# Patient Record
Sex: Female | Born: 1965 | Race: White | Hispanic: No | Marital: Married
Health system: Southern US, Community
[De-identification: ages and names within clinical notes are randomized; demographics above are authoritative.]

## PROBLEM LIST (undated history)

## (undated) HISTORY — PX: ABDOMINAL HYSTERECTOMY: SHX81

---

## 2011-08-18 ENCOUNTER — Ambulatory Visit: Payer: Self-pay | Admitting: Family Medicine

## 2011-11-25 ENCOUNTER — Ambulatory Visit: Payer: Self-pay | Admitting: Obstetrics and Gynecology

## 2011-11-25 LAB — BASIC METABOLIC PANEL
Anion Gap: 11 (ref 7–16)
BUN: 9 mg/dL (ref 7–18)
Chloride: 104 mmol/L (ref 98–107)
Creatinine: 0.57 mg/dL — ABNORMAL LOW (ref 0.60–1.30)
EGFR (Non-African Amer.): >60
Glucose: 100 mg/dL — ABNORMAL HIGH (ref 65–99)
Osmolality: 284 (ref 275–301)
Potassium: 4.7 mmol/L (ref 3.5–5.1)
Sodium: 143 mmol/L (ref 136–145)

## 2011-11-25 LAB — HEMOGLOBIN: HGB: 13.2 g/dL (ref 12.0–16.0)

## 2011-11-25 LAB — PREGNANCY, URINE: Pregnancy Test, Urine: NEGATIVE m[IU]/mL

## 2011-11-30 ENCOUNTER — Ambulatory Visit: Payer: Self-pay | Admitting: Obstetrics and Gynecology

## 2011-12-01 LAB — BASIC METABOLIC PANEL
Anion Gap: 10 (ref 7–16)
BUN: 9 mg/dL (ref 7–18)
Calcium, Total: 9 mg/dL (ref 8.5–10.1)
Creatinine: 0.77 mg/dL (ref 0.60–1.30)
EGFR (African American): >60
EGFR (Non-African Amer.): >60
Glucose: 168 mg/dL — ABNORMAL HIGH (ref 65–99)
Osmolality: 284 (ref 275–301)
Potassium: 3.9 mmol/L (ref 3.5–5.1)
Sodium: 141 mmol/L (ref 136–145)

## 2012-10-11 ENCOUNTER — Ambulatory Visit: Payer: Self-pay | Admitting: Family Medicine

## 2014-01-25 ENCOUNTER — Ambulatory Visit: Payer: Self-pay | Admitting: Family Medicine

## 2015-01-27 NOTE — Op Note (Signed)
PATIENT NAME:  Emma Beasley, HILLIGOSS MR#:  [id number] DATE OF BIRTH:  1966-08-10  DATE OF PROCEDURE:  11/30/2011  PREOPERATIVE DIAGNOSES: 1. Menorrhagia.  2. Fibroid uterus.   POSTOPERATIVE DIAGNOSES:  1. Menorrhagia.  2. Fibroid uterus.   PROCEDURES:  1. Laparoscopic supracervical hysterectomy.  2. Cystoscopy.   SURGEON: Suzy Bouchard, MD   FIRST ASSISTANT: Veatrice Bourbon, MD  ANESTHESIA: General.   INDICATIONS: This is a 49 year old gravida 1, para 1 obese female with a long history of abnormal uterine bleeding and heavy menorrhagia. The patient was noted to have at least five fibroids and a submucosal fibroid on saline infusion sonohysterography. Endometrial biopsy negative.   DESCRIPTION OF PROCEDURE: After adequate general endotracheal anesthesia, the patient was prepped and draped in normal sterile fashion. Abdominal, perineal and vaginal prep performed. The patient did receive 2 grams of IV cefoxitin prior to commencement of the case. A speculum was placed in the vagina and the anterior cervix was grasped with a single-tooth tenaculum and a uterine sound placed into the endometrial cavity and secured together to be used for uterine manipulation during the procedure. A Foley catheter was placed into the bladder and yielded 100 mL clear urine. An extreme obese abdomen was then evaluated. Two Allis clamps were placed on the infraumbilical area and injected with 0.5% Marcaine without epinephrine. Towel clips were used to help raise the abdomen as the laparoscope was advanced into the abdominal cavity under direct visualization with the Optiview cannula. The patient's abdomen was insufflated with carbon dioxide. The patient was placed in deep Trendelenburg given extreme obesity. A second port site was placed 3 cm medial to the left anterior iliac spine. A 10 mm port trocar was advanced into the abdominal cavity under direct visualization. A third port site was placed on the right side 3 cm  medial to the anterior iliac spine and again a 10 mm port advanced into the abdominal cavity under direct visualization. Visualization was difficult given the amount of redundant bowel with preperitoneal fat. Visualization was diminished. A fourth port site was placed to the left lower abdomen and a 10 mm port  again was advanced under direct visualization. A fan retractor was brought up to the operative field and this was used to retract the bowel cephalad. A single-tooth tenaculum was placed on the left cornua and retracted to the right. A Harmonic scalpel was brought up to the operative field and the round ligament was cauterized and opened. The utero-ovarian ligament was cauterized with Kleppingers and then transected with the Harmonic scalpel. Serial bites through the broad ligament was then performed. The anterior leaf of the peritoneum was then opened with the Harmonic scalpel. Ultimately, the left uterine artery was cauterized and transected with the Harmonic scalpel. Attention was then directed to the right round ligament after grasping the uterus and retracting the uterus to the left. The round ligament was opened followed by the right. The utero-ovarian ligament was then clamped, transected. Serial bites of the broad ligament and ultimately the right uterine artery was visualized, cauterized and opened with the Harmonic scalpel. The uterus was then transected at the level of the uterosacral ligaments. The uterine sound was removed. A Kleppinger Bovie was placed into the cervical stump and cauterization then took place. No active bleeding at that point. The morcellator was then brought up to the operative field and placed through one of the two left lower abdominal port sites, and the uterus was then morcellated in standard fashion. The patient's abdomen was  copiously irrigated. Some spot cautery was used with the Bovie with good hemostatic effect. Given the amount of dissection and cautery on the left,  the surgeon felt it to be prudent to do cystoscopy, which was performed after injecting an amp of indigo carmine intravenously. The bladder was filled and the ureteral ostia were identified with normal pluming effect of blue dye bilaterally. Foley was replaced. Bladder was drained. The cervical stump and adnexal structures were all hemostatic. Pressure was lowered to 8 mmHg to ensure this. The patient's abdomen was then deflated and the incisions were closed with two layer fashion with a deeper layer with a 2-0 Vicryl suture and the skin was then closed with interrupted 4-0 Vicryl suture. The skin was reapproximated with Steri-Strips and sterile dressings  applied. The single-tooth tenaculum was removed from the cervix and there was no active bleeding. The procedure was terminated. Estimated blood loss 100 mL.         Intraoperative fluids 1400 mL. The patient tolerated the procedure well and went to the Recovery Room in good condition.  ____________________________ Suzy Bouchardhomas J. Schermerhorn, MD tjs:slb D: 11/30/2011 10:53:39 ET T: 11/30/2011 11:04:21 ET JOB#: [id number]  cc: Suzy Bouchardhomas J. Schermerhorn, MD, <Dictator> Suzy BouchardHOMAS J SCHERMERHORN MD ELECTRONICALLY SIGNED 12/02/2011 9:39

## 2015-01-27 NOTE — Discharge Summary (Signed)
PATIENT NAME:  Emma Beasley, Emma Beasley MR#:  [id number] DATE OF BIRTH:  10-27-1965  DATE OF ADMISSION:  11/30/2011 DATE OF DISCHARGE:  12/01/2011  PRINCIPAL  PROCEDURE: Laparoscopic supracervical hysterectomy.    HOSPITAL COURSE: On postoperative day one, the patient was doing well, minimal pain, tolerating regular food. Vital signs were stable. Hematocrit was 35.1 and BUN and creatinine of 9 and 0.7, respectively. The patient is discharged to home in good condition.   DISCHARGE MEDICATIONS:  1. Norco 5/325, 1 to 2 tablets every 4 to 6 hours as needed.  2. Naprosyn 500 mg b.i.d.  as needed for pain.   FOLLOWUP: The patient will follow up with Dr. Feliberto GottronSchermerhorn in two weeks or before if she has wound drainage, fever, nausea, vomiting.   ____________________________ Suzy Bouchardhomas J. Schermerhorn, MD tjs:cbb D: 12/01/2011 08:48:18 ET T: 12/01/2011 16:35:31 ET JOB#: [id number]  cc: Suzy Bouchardhomas J. Schermerhorn, MD, <Dictator> Suzy BouchardHOMAS J SCHERMERHORN MD ELECTRONICALLY SIGNED 12/02/2011 9:39

## 2015-01-29 ENCOUNTER — Ambulatory Visit
Admit: 2015-01-29 | Disposition: A | Discharge status: Home / Self Care | Payer: Self-pay | Attending: Family Medicine | Admitting: Family Medicine

## 2016-02-20 ENCOUNTER — Other Ambulatory Visit: Payer: Self-pay | Admitting: Family Medicine

## 2016-02-20 DIAGNOSIS — Z1231 Encounter for screening mammogram for malignant neoplasm of breast: Secondary | ICD-10-CM

## 2016-02-28 ENCOUNTER — Ambulatory Visit: Payer: Self-pay

## 2016-03-23 ENCOUNTER — Ambulatory Visit
Admission: RE | Admit: 2016-03-23 | Discharge: 2016-03-23 | Disposition: A | Discharge status: Home / Self Care | Payer: Federal, State, Local not specified - PPO | Source: Ambulatory Visit | Attending: Family Medicine | Admitting: Family Medicine

## 2016-03-23 DIAGNOSIS — Z1231 Encounter for screening mammogram for malignant neoplasm of breast: Secondary | ICD-10-CM | POA: Insufficient documentation

## 2016-03-26 ENCOUNTER — Other Ambulatory Visit: Payer: Self-pay | Admitting: Family Medicine

## 2016-03-26 DIAGNOSIS — N6459 Other signs and symptoms in breast: Secondary | ICD-10-CM

## 2016-03-26 DIAGNOSIS — N6310 Unspecified lump in the right breast, unspecified quadrant: Secondary | ICD-10-CM

## 2016-04-02 ENCOUNTER — Ambulatory Visit
Admission: RE | Admit: 2016-04-02 | Discharge: 2016-04-02 | Disposition: A | Discharge status: Home / Self Care | Payer: Federal, State, Local not specified - PPO | Source: Ambulatory Visit | Attending: Family Medicine | Admitting: Family Medicine

## 2016-04-02 DIAGNOSIS — N6310 Unspecified lump in the right breast, unspecified quadrant: Secondary | ICD-10-CM

## 2016-04-02 DIAGNOSIS — N6459 Other signs and symptoms in breast: Secondary | ICD-10-CM

## 2016-04-02 DIAGNOSIS — N63 Unspecified lump in breast: Secondary | ICD-10-CM | POA: Diagnosis not present

## 2017-03-18 ENCOUNTER — Other Ambulatory Visit: Payer: Self-pay | Admitting: Family Medicine

## 2017-03-18 DIAGNOSIS — Z1231 Encounter for screening mammogram for malignant neoplasm of breast: Secondary | ICD-10-CM

## 2017-04-01 ENCOUNTER — Ambulatory Visit: Payer: Federal, State, Local not specified - PPO

## 2017-04-05 ENCOUNTER — Ambulatory Visit
Admission: RE | Admit: 2017-04-05 | Discharge: 2017-04-05 | Disposition: A | Discharge status: Home / Self Care | Payer: Federal, State, Local not specified - PPO | Source: Ambulatory Visit | Attending: Family Medicine | Admitting: Family Medicine

## 2017-04-05 DIAGNOSIS — Z1231 Encounter for screening mammogram for malignant neoplasm of breast: Secondary | ICD-10-CM | POA: Diagnosis present

## 2018-04-13 ENCOUNTER — Other Ambulatory Visit: Payer: Self-pay | Admitting: Family Medicine

## 2018-04-13 DIAGNOSIS — Z1231 Encounter for screening mammogram for malignant neoplasm of breast: Secondary | ICD-10-CM

## 2018-04-29 ENCOUNTER — Ambulatory Visit
Admission: RE | Admit: 2018-04-29 | Discharge: 2018-04-29 | Disposition: A | Discharge status: Home / Self Care | Payer: Federal, State, Local not specified - PPO | Source: Ambulatory Visit | Attending: Family Medicine | Admitting: Family Medicine

## 2018-04-29 DIAGNOSIS — Z1231 Encounter for screening mammogram for malignant neoplasm of breast: Secondary | ICD-10-CM | POA: Insufficient documentation

## 2019-05-04 ENCOUNTER — Other Ambulatory Visit: Payer: Self-pay | Admitting: Family Medicine

## 2019-05-04 DIAGNOSIS — Z1231 Encounter for screening mammogram for malignant neoplasm of breast: Secondary | ICD-10-CM

## 2019-06-14 ENCOUNTER — Ambulatory Visit
Admission: RE | Admit: 2019-06-14 | Discharge: 2019-06-14 | Disposition: A | Discharge status: Home / Self Care | Payer: Federal, State, Local not specified - PPO | Source: Ambulatory Visit | Attending: Family Medicine | Admitting: Family Medicine

## 2019-06-14 DIAGNOSIS — Z1231 Encounter for screening mammogram for malignant neoplasm of breast: Secondary | ICD-10-CM | POA: Insufficient documentation

## 2021-04-12 IMAGING — MG MM DIGITAL SCREENING BILAT W/ TOMO W/ CAD
6 of 12 series · 6 of 36 positions shown · non-contrast
Comparison: Previous exam(s).

CLINICAL DATA: Screening.

EXAM:
DIGITAL SCREENING BILATERAL MAMMOGRAM WITH TOMO AND CAD

[R MLO synth-2D (1 of 2)]
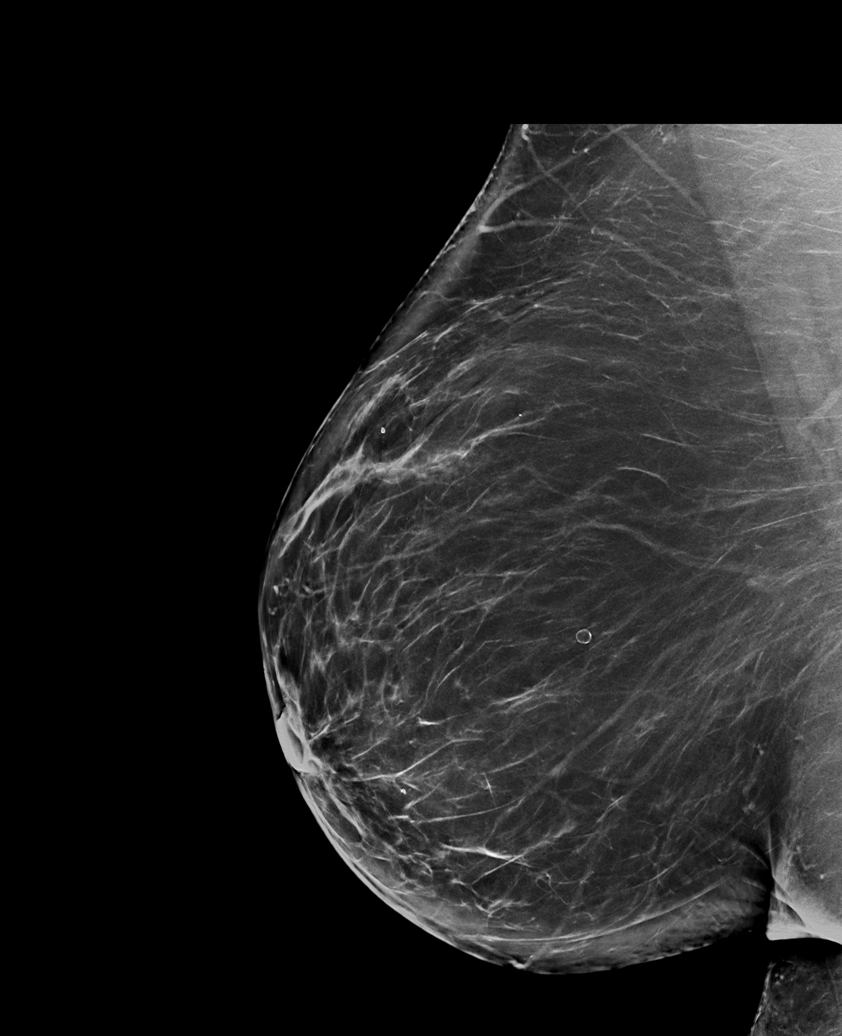

[L CV synth-2D]
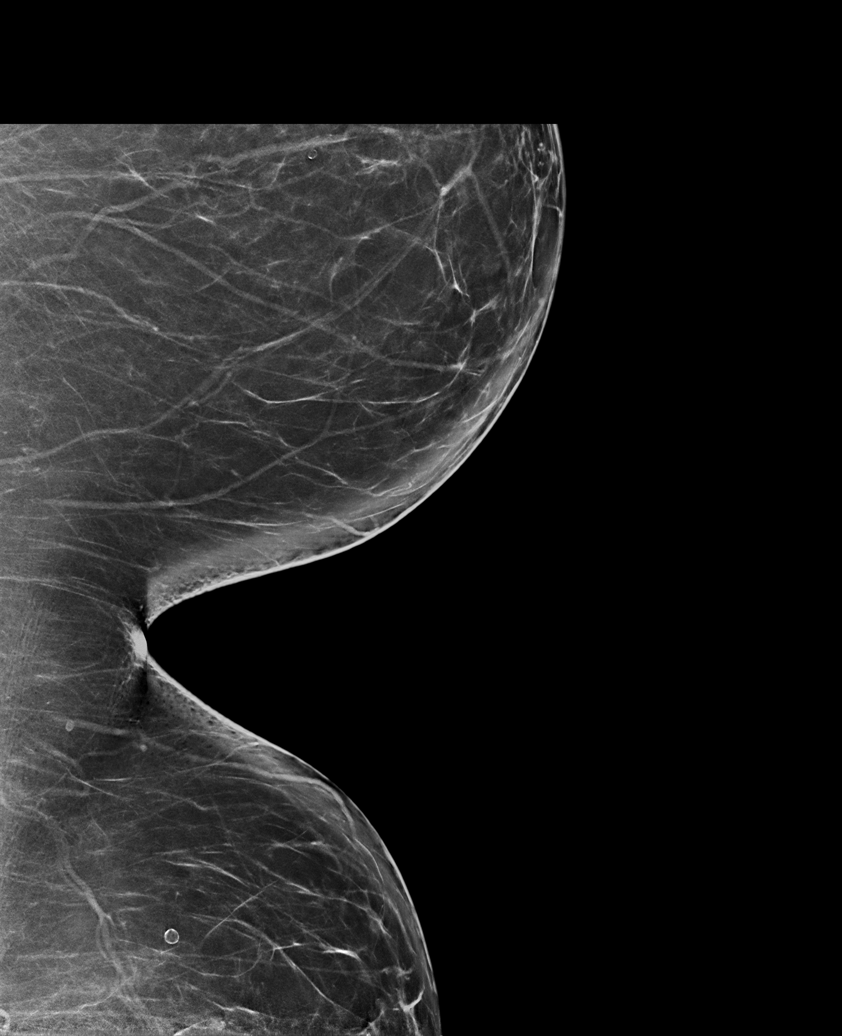

[L MLO synth-2D]
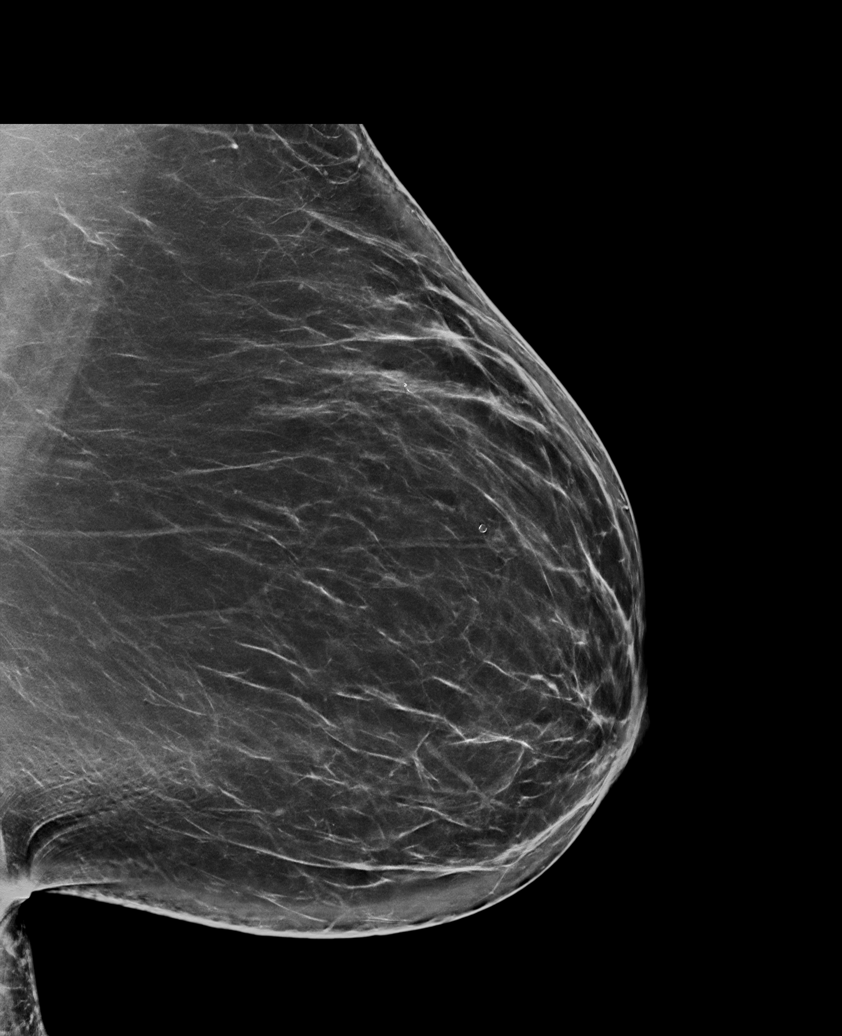

[R MLO synth-2D (2 of 2)]
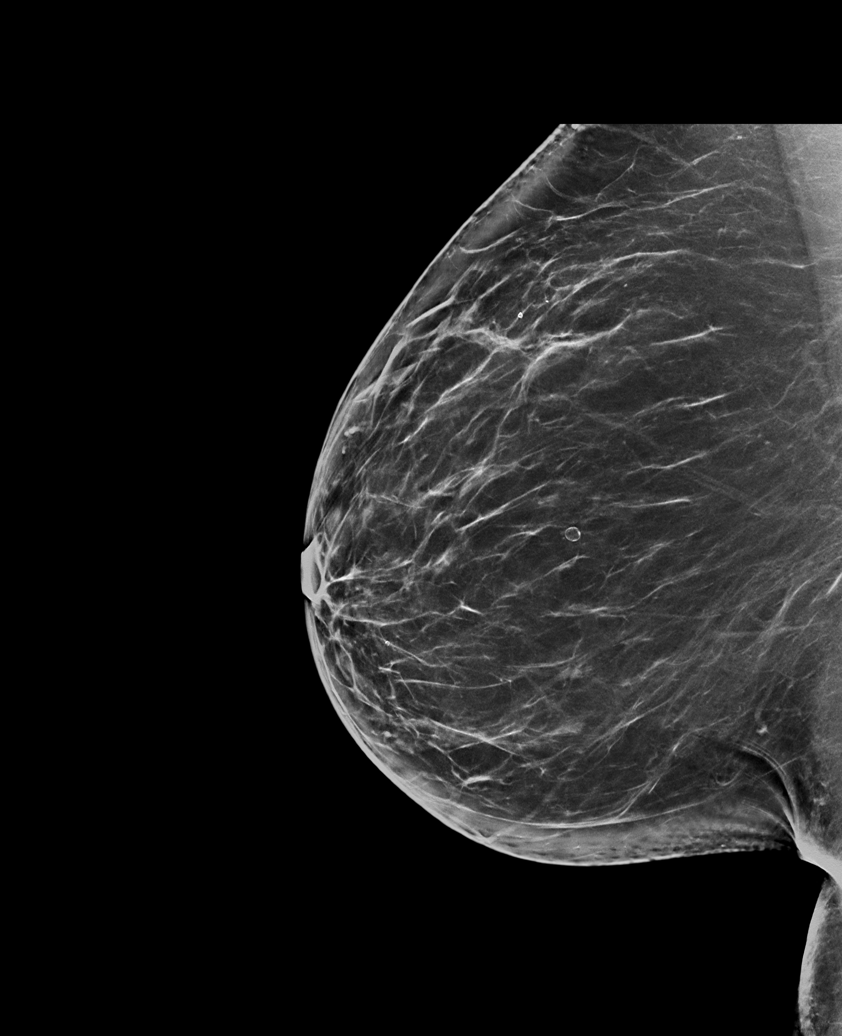

[R CC synth-2D]
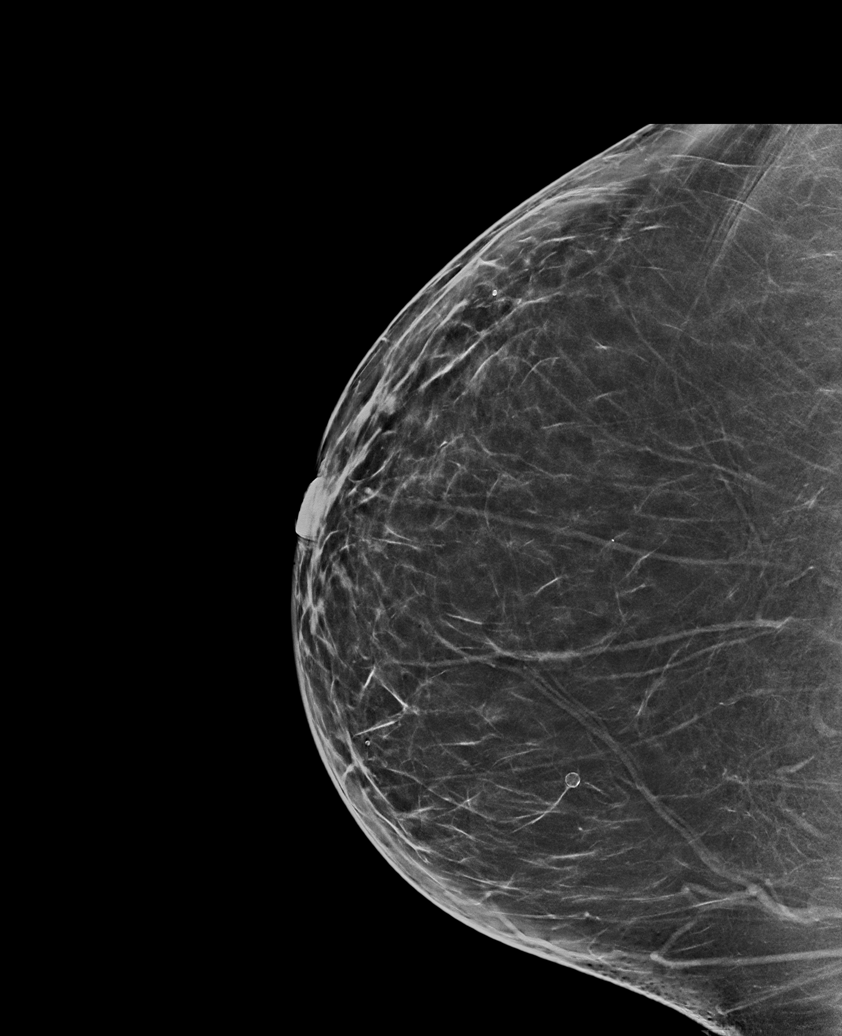

[L CC synth-2D]
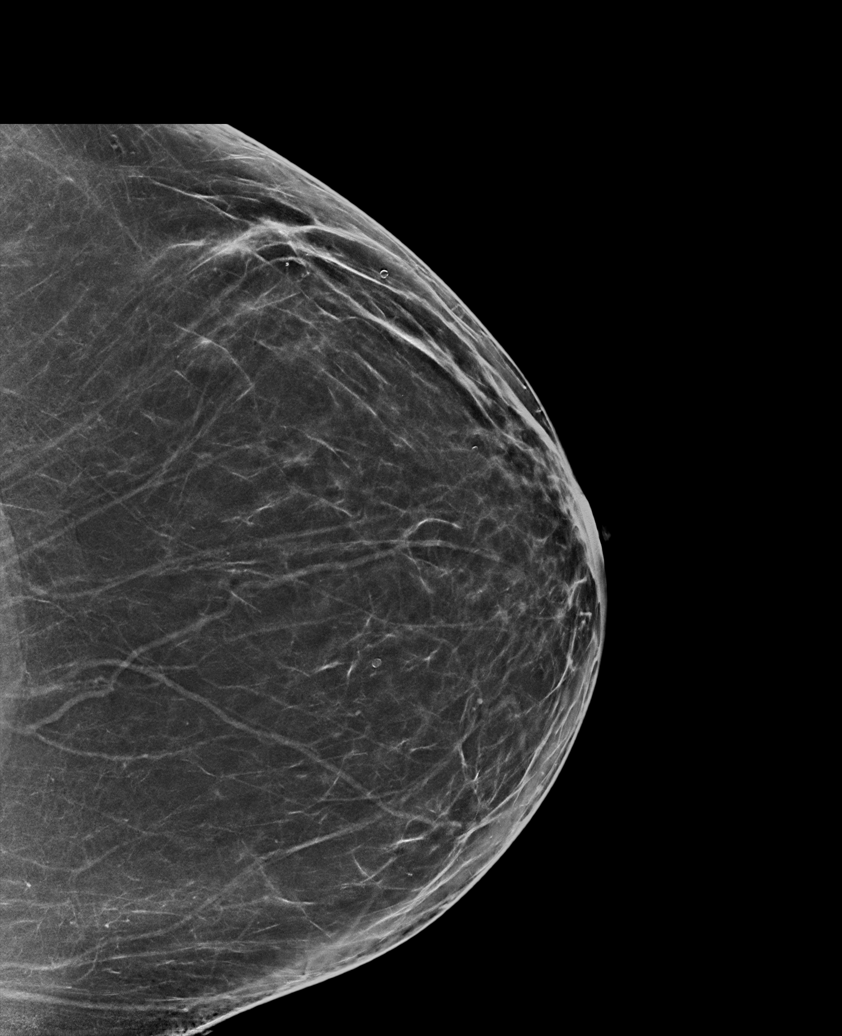

[6 of 36 positions shown; findings below may reference images not displayed]

ACR Breast Density Category b: There are scattered areas of
fibroglandular density.
FINDINGS: There are no findings suspicious for malignancy. Images were
processed with CAD.
IMPRESSION: No mammographic evidence of malignancy. A result letter of this
screening mammogram will be mailed directly to the patient.

RECOMMENDATION:
Screening mammogram in one year. (Code:CN-[id number])

BI-RADS CATEGORY  1: Negative.
